# Patient Record
Sex: Male | Born: 1980 | Race: Black or African American | Hispanic: No | Marital: Single | Smoking: Never smoker
Health system: Southern US, Community
[De-identification: ages and names within clinical notes are randomized; demographics above are authoritative.]

## PROBLEM LIST (undated history)

## (undated) DIAGNOSIS — B009 Herpesviral infection, unspecified: Secondary | ICD-10-CM

## (undated) DIAGNOSIS — J45909 Unspecified asthma, uncomplicated: Secondary | ICD-10-CM

## (undated) HISTORY — PX: LEG SURGERY: SHX1003

## (undated) HISTORY — PX: FEMUR FRACTURE SURGERY: SHX633

---

## 1998-06-07 ENCOUNTER — Encounter: Payer: Self-pay | Admitting: Emergency Medicine

## 1998-06-07 ENCOUNTER — Emergency Department (HOSPITAL_COMMUNITY): Admission: EM | Admit: 1998-06-07 | Discharge: 1998-06-07 | Payer: Self-pay | Admitting: Emergency Medicine

## 2002-04-04 ENCOUNTER — Emergency Department (HOSPITAL_COMMUNITY): Admission: EM | Admit: 2002-04-04 | Discharge: 2002-04-04 | Payer: Self-pay | Admitting: Emergency Medicine

## 2005-08-16 ENCOUNTER — Inpatient Hospital Stay (HOSPITAL_COMMUNITY): Admission: EM | Admit: 2005-08-16 | Discharge: 2005-08-18 | Payer: Self-pay | Admitting: Emergency Medicine

## 2016-02-16 ENCOUNTER — Emergency Department (HOSPITAL_BASED_OUTPATIENT_CLINIC_OR_DEPARTMENT_OTHER)
Admission: EM | Admit: 2016-02-16 | Discharge: 2016-02-16 | Disposition: A | Discharge status: Home / Self Care | Payer: BLUE CROSS/BLUE SHIELD | Attending: Emergency Medicine | Admitting: Emergency Medicine

## 2016-02-16 ENCOUNTER — Encounter (HOSPITAL_BASED_OUTPATIENT_CLINIC_OR_DEPARTMENT_OTHER): Payer: Self-pay

## 2016-02-16 ENCOUNTER — Emergency Department (HOSPITAL_BASED_OUTPATIENT_CLINIC_OR_DEPARTMENT_OTHER): Payer: BLUE CROSS/BLUE SHIELD

## 2016-02-16 DIAGNOSIS — J45901 Unspecified asthma with (acute) exacerbation: Secondary | ICD-10-CM | POA: Diagnosis not present

## 2016-02-16 DIAGNOSIS — R0602 Shortness of breath: Secondary | ICD-10-CM | POA: Diagnosis present

## 2016-02-16 DIAGNOSIS — Z79899 Other long term (current) drug therapy: Secondary | ICD-10-CM | POA: Insufficient documentation

## 2016-02-16 HISTORY — DX: Unspecified asthma, uncomplicated: J45.909

## 2016-02-16 LAB — BASIC METABOLIC PANEL
ANION GAP: 9 (ref 5–15)
BUN: 8 mg/dL (ref 6–20)
CALCIUM: 9.4 mg/dL (ref 8.9–10.3)
CHLORIDE: 104 mmol/L (ref 101–111)
CO2: 25 mmol/L (ref 22–32)
Creatinine, Ser: 1.14 mg/dL (ref 0.61–1.24)
GFR calc non Af Amer: >60 mL/min (ref >60)
Glucose, Bld: 110 mg/dL — ABNORMAL HIGH (ref 65–99)
POTASSIUM: 3.4 mmol/L — AB (ref 3.5–5.1)
Sodium: 138 mmol/L (ref 135–145)

## 2016-02-16 LAB — CBC WITH DIFFERENTIAL/PLATELET
BASOS ABS: 0.1 10*3/uL (ref 0.0–0.1)
BASOS PCT: 1 %
Eosinophils Absolute: 1.4 10*3/uL — ABNORMAL HIGH (ref 0.0–0.7)
Eosinophils Relative: 15 %
HEMATOCRIT: 46.4 % (ref 39.0–52.0)
Hemoglobin: 15.9 g/dL (ref 13.0–17.0)
Lymphocytes Relative: 31 %
Lymphs Abs: 2.9 10*3/uL (ref 0.7–4.0)
MCH: 31.4 pg (ref 26.0–34.0)
MCHC: 34.3 g/dL (ref 30.0–36.0)
MCV: 91.5 fL (ref 78.0–100.0)
Monocytes Absolute: 0.7 10*3/uL (ref 0.1–1.0)
Monocytes Relative: 7 %
NEUTROS ABS: 4.3 10*3/uL (ref 1.7–7.7)
NEUTROS PCT: 46 %
Platelets: 238 10*3/uL (ref 150–400)
RBC: 5.07 MIL/uL (ref 4.22–5.81)
RDW: 12.2 % (ref 11.5–15.5)
WBC: 9.3 10*3/uL (ref 4.0–10.5)

## 2016-02-16 LAB — D-DIMER, QUANTITATIVE: D-Dimer, Quant: <0.27 ug/mL-FEU (ref 0.00–0.50)

## 2016-02-16 MED ORDER — MAGNESIUM SULFATE 2 GM/50ML IV SOLN
2.0000 g | Freq: Once | INTRAVENOUS | Status: AC
  Administered 2016-02-16: 2 g via INTRAVENOUS
  Filled 2016-02-16: qty 50

## 2016-02-16 MED ORDER — IPRATROPIUM-ALBUTEROL 0.5-2.5 (3) MG/3ML IN SOLN
RESPIRATORY_TRACT | Status: AC
  Administered 2016-02-16: 9 mL
  Filled 2016-02-16: qty 9

## 2016-02-16 MED ORDER — ALBUTEROL SULFATE HFA 108 (90 BASE) MCG/ACT IN AERS
2.0000 | INHALATION_SPRAY | Freq: Once | RESPIRATORY_TRACT | Status: AC
  Administered 2016-02-16: 2 via RESPIRATORY_TRACT
  Filled 2016-02-16: qty 6.7

## 2016-02-16 MED ORDER — METHYLPREDNISOLONE SODIUM SUCC 125 MG IJ SOLR
125.0000 mg | Freq: Once | INTRAMUSCULAR | Status: AC
  Administered 2016-02-16: 125 mg via INTRAVENOUS

## 2016-02-16 MED ORDER — MOMETASONE FURO-FORMOTEROL FUM 100-5 MCG/ACT IN AERO: 2.0000 | INHALATION_SPRAY | Freq: Two times a day (BID) | RESPIRATORY_TRACT | 0 refills | Status: DC

## 2016-02-16 MED ORDER — PREDNISONE 50 MG PO TABS: 50.0000 mg | ORAL_TABLET | Freq: Every day | ORAL | 0 refills | Status: DC

## 2016-02-16 MED ORDER — PREDNISONE 50 MG PO TABS
60.0000 mg | ORAL_TABLET | Freq: Once | ORAL | Status: DC
  Filled 2016-02-16: qty 1

## 2016-02-16 MED ORDER — IPRATROPIUM BROMIDE 0.02 % IN SOLN
0.5000 mg | Freq: Once | RESPIRATORY_TRACT | Status: AC
Start: 2016-02-16 — End: 2016-02-16
  Administered 2016-02-16: 0.5 mg via RESPIRATORY_TRACT
  Filled 2016-02-16: qty 2.5

## 2016-02-16 MED ORDER — ALBUTEROL (5 MG/ML) CONTINUOUS INHALATION SOLN
10.0000 mg/h | INHALATION_SOLUTION | RESPIRATORY_TRACT | Status: DC
  Administered 2016-02-16: 10 mg/h via RESPIRATORY_TRACT
  Filled 2016-02-16: qty 20

## 2016-02-16 MED ORDER — SODIUM CHLORIDE 0.9 % IV BOLUS (SEPSIS)
1000.0000 mL | Freq: Once | INTRAVENOUS | Status: AC
  Administered 2016-02-16: 1000 mL via INTRAVENOUS

## 2016-02-16 MED ORDER — METHYLPREDNISOLONE SODIUM SUCC 125 MG IJ SOLR
INTRAMUSCULAR | Status: AC
  Filled 2016-02-16: qty 2

## 2016-02-16 NOTE — ED Notes (Signed)
ED Provider at bedside. 

## 2016-02-16 NOTE — ED Triage Notes (Signed)
C/o SOB, wheezing x this am

## 2016-02-16 NOTE — Discharge Instructions (Signed)
Take inhaler 2 puffs every 4 hrs. Take prednisone as prescribed until all gone, next dose tomorrow. Use Dulera as prescribed. Return if worsening symptoms, otherwise follow up with family doctor for recheck.

## 2016-02-16 NOTE — ED Notes (Addendum)
Pt amb w/o difficulty O2sat 95%

## 2016-02-16 NOTE — ED Provider Notes (Signed)
[facility]   CSN: [id number] Arrival date & time: 02/16/16  1637  By signing my name below, I, Javier Dockerobert Ryan Halas, attest that this documentation has been prepared under the direction and in the presence of Karel Jarvisatiana Kirachenko, PA-C. Electronically Signed: Javier Dockerobert Ryan Halas, ER Scribe. 09/11/2015. 5:04 PM.  History   Chief Complaint Chief Complaint  Patient presents with  . Shortness of Breath   The history is provided by the patient. No language interpreter was used.    HPI Comments: Gregory Vance is a 36 y.o. male who presents to the Emergency Department complaining of SOB since this morning. He has a PMHx of asthma. He denies cough, CP, swelling in legs. He has used his inhaler 10 times today before coming in to the ED. He states he visited his daughter at her mother's house and she has a [place] pig. He believes being around the pet exacerbated his asthma sx. He has been doing increased driving to and from [place]. His last asthma exacerbation was two years ago around the same time. He normally uses dulara but has been out of that medication.   He states he was recently prescribed abx due to a skin rash in his groin.   Past Medical History:  Diagnosis Date  . Asthma     There are no active problems to display for this patient.   Past Surgical History:  Procedure Laterality Date  . FEMUR FRACTURE SURGERY       Home Medications    Prior to Admission medications   Medication Sig Start Date End Date Taking? Authorizing Provider  ALBUTEROL IN Inhale into the lungs.   Yes Historical Provider, MD  mometasone-formoterol (DULERA) 100-5 MCG/ACT AERO Inhale 2 puffs into the lungs 2 (two) times daily.   Yes Historical Provider, MD    Family History No family history on file.  Social History Social History  Substance Use Topics  . Smoking status: Never Smoker  . Smokeless tobacco: Never Used  . Alcohol use No     Allergies   Patient has  no known allergies.   Review of Systems Review of Systems  Respiratory: Positive for shortness of breath. Negative for cough.   Cardiovascular: Negative for chest pain.   A complete 10 system review of systems was obtained and all systems are negative except as noted in the HPI and PMH.    Physical Exam Updated Vital Signs BP 134/96 (BP Location: Right Arm)   Pulse (!) 130   Temp 98.1 F (36.7 C) (Oral)   Resp 24   Ht 5\' 7"  (1.702 m)   Wt 185 lb (83.9 kg)   SpO2 (!) 89%   BMI 28.98 kg/m   Physical Exam  Constitutional: He is oriented to person, place, and time. He appears well-developed and well-nourished. No distress.  HENT:  Head: Normocephalic and atraumatic.  Eyes: Pupils are equal, round, and reactive to light.  Neck: Normal range of motion. Neck supple.  Cardiovascular: Normal rate, regular rhythm and normal heart sounds.   Pulmonary/Chest: Effort normal. No respiratory distress. He has wheezes. He has no rales.  Musculoskeletal: Normal range of motion.  Neurological: He is alert and oriented to person, place, and time. Coordination normal.  Skin: Skin is warm and dry. He is not diaphoretic.  Psychiatric: He has a normal mood and affect. His behavior is normal.  Nursing note and vitals reviewed.  5:02 PM O2 sat 91% post neb.     ED  Treatments / Results  DIAGNOSTIC STUDIES: Oxygen Saturation is 89% on RA, low by my interpretation.    COORDINATION OF CARE: 5:05 PM Discussed treatment plan with pt at bedside and pt agreed to plan.  Labs (all labs ordered are listed, but only abnormal results are displayed) Labs Reviewed  CBC WITH DIFFERENTIAL/PLATELET - Abnormal; Notable for the following:       Result Value   Eosinophils Absolute 1.4 (*)    All other components within normal limits  BASIC METABOLIC PANEL - Abnormal; Notable for the following:    Potassium 3.4 (*)    Glucose, Bld 110 (*)    All other components within normal limits  D-DIMER, QUANTITATIVE  (NOT AT [facility])    EKG  EKG Interpretation  Date/Time:  Wednesday February 16 2016 17:13:50 EST Ventricular Rate:  129 PR Interval:    QRS Duration: 82 QT Interval:  307 QTC Calculation: 450 R Axis:   71 Text Interpretation:  Sinus tachycardia Low voltage, extremity and precordial leads No STEMI.  Confirmed by LONG MD, JOSHUA [phone]) on 02/16/2016 5:19:19 PM       Radiology Dg Chest 2 View  Result Date: 02/16/2016 CLINICAL DATA:  Shortness of breath today.  History of asthma. EXAM: CHEST  2 VIEW COMPARISON:  Chest radiograph August 16, 2005 FINDINGS: Cardiomediastinal silhouette is normal. No pleural effusions or focal consolidations. Trachea projects midline and there is no pneumothorax. Soft tissue planes and included osseous structures are non-suspicious. IMPRESSION: Normal chest. Electronically Signed   By: Awilda Metro M.D.   On: 02/16/2016 17:25    Procedures Procedures (including critical care time)  Medications Ordered in ED Medications  methylPREDNISolone sodium succinate (SOLU-MEDROL) 125 mg/2 mL injection (not administered)  albuterol (PROVENTIL,VENTOLIN) solution continuous neb (0 mg/hr Nebulization Stopped 02/16/16 1832)  ipratropium-albuterol (DUONEB) 0.5-2.5 (3) MG/3ML nebulizer solution (9 mLs  Given 02/16/16 1647)  methylPREDNISolone sodium succinate (SOLU-MEDROL) 125 mg/2 mL injection 125 mg (125 mg Intravenous Given 02/16/16 1708)  ipratropium (ATROVENT) nebulizer solution 0.5 mg (0.5 mg Nebulization Given 02/16/16 1741)  sodium chloride 0.9 % bolus 1,000 mL (0 mLs Intravenous Stopped 02/16/16 2000)  magnesium sulfate IVPB 2 g 50 mL (0 g Intravenous Stopped 02/16/16 2000)  albuterol (PROVENTIL HFA;VENTOLIN HFA) 108 (90 Base) MCG/ACT inhaler 2 puff (2 puffs Inhalation Given 02/16/16 2029)     Initial Impression / Assessment and Plan / ED Course  I have reviewed the triage vital signs and the nursing notes.  Pertinent labs & imaging results that were available  during my care of the patient were reviewed by me and considered in my medical decision making (see chart for details).  Clinical Course    Pt in ED with wheezing, SOB, chest tightness onset this morning. Believes triggered by a guinnea pig. Pt with diffuse wheezing, tachypnea, some accesory muscle use. Already started on duoneb by respiratory therapist. Pt is under risk for PE given prolonged car travel, he is tachycardic in 130s, hypoxic with oxygen sat of 89 % on RA. Will get labs, CXR, D dimer.    Pt not improved with breathing treatment. Will start on hour long cont neb. CXR, labs normal.   Pt still wheezing and tachycardic after hour long tx. Suspect a lot of tachycardia is from albuterol, since pt states he feels much better. He is no longer tachypnec, no accessory muscle use. Will try fluids and magnesium.   8:06 PM Pt feels much better. HR is 118. Pt ambulated around dept, oxygen stayed at  95%. Pt states he feels better. He would like to go home. Will dc home with prednisone, continue inhaler, will refill dulera   Final Clinical Impressions(s) / ED Diagnoses   Final diagnoses:  Exacerbation of asthma, unspecified asthma severity, unspecified whether persistent    New Prescriptions Discharge Medication List as of 02/16/2016  8:25 PM    START taking these medications   Details  !! mometasone-formoterol (DULERA) 100-5 MCG/ACT AERO Inhale 2 puffs into the lungs 2 (two) times daily., Starting Wed 02/16/2016, Print    predniSONE (DELTASONE) 50 MG tablet Take 1 tablet (50 mg total) by mouth daily., Starting Wed 02/16/2016, Print     !! - Potential duplicate medications found. Please discuss with provider.     I personally performed the services described in this documentation, which was scribed in my presence. The recorded information has been reviewed and is accurate.      Jaynie Crumble, PA-C 02/16/16 2220    Maia Plan, MD 02/17/16 [phone]

## 2017-11-12 ENCOUNTER — Emergency Department (HOSPITAL_BASED_OUTPATIENT_CLINIC_OR_DEPARTMENT_OTHER)
Admission: EM | Admit: 2017-11-12 | Discharge: 2017-11-13 | Disposition: A | Discharge status: Home / Self Care | Payer: BLUE CROSS/BLUE SHIELD | Attending: Emergency Medicine | Admitting: Emergency Medicine

## 2017-11-12 ENCOUNTER — Encounter (HOSPITAL_BASED_OUTPATIENT_CLINIC_OR_DEPARTMENT_OTHER): Payer: Self-pay

## 2017-11-12 ENCOUNTER — Other Ambulatory Visit: Payer: Self-pay

## 2017-11-12 DIAGNOSIS — J4541 Moderate persistent asthma with (acute) exacerbation: Secondary | ICD-10-CM | POA: Insufficient documentation

## 2017-11-12 DIAGNOSIS — Z79899 Other long term (current) drug therapy: Secondary | ICD-10-CM | POA: Diagnosis not present

## 2017-11-12 DIAGNOSIS — J45909 Unspecified asthma, uncomplicated: Secondary | ICD-10-CM | POA: Diagnosis present

## 2017-11-12 HISTORY — DX: Herpesviral infection, unspecified: B00.9

## 2017-11-12 MED ORDER — IPRATROPIUM-ALBUTEROL 0.5-2.5 (3) MG/3ML IN SOLN
RESPIRATORY_TRACT | Status: AC
  Administered 2017-11-12: 3 mL
  Filled 2017-11-12: qty 3

## 2017-11-12 MED ORDER — ALBUTEROL (5 MG/ML) CONTINUOUS INHALATION SOLN
15.0000 mg/h | INHALATION_SOLUTION | RESPIRATORY_TRACT | Status: AC
  Administered 2017-11-12: 15 mg/h via RESPIRATORY_TRACT
  Filled 2017-11-12: qty 20

## 2017-11-12 MED ORDER — ALBUTEROL SULFATE (2.5 MG/3ML) 0.083% IN NEBU
INHALATION_SOLUTION | RESPIRATORY_TRACT | Status: AC
  Administered 2017-11-12: 2.5 mg
  Filled 2017-11-12: qty 3

## 2017-11-12 MED ORDER — PREDNISONE 50 MG PO TABS: 50.0000 mg | ORAL_TABLET | Freq: Every day | ORAL | 0 refills | Status: DC

## 2017-11-12 MED ORDER — MOMETASONE FURO-FORMOTEROL FUM 100-5 MCG/ACT IN AERO: 2.0000 | INHALATION_SPRAY | Freq: Two times a day (BID) | RESPIRATORY_TRACT | 0 refills | Status: DC

## 2017-11-12 MED ORDER — PREDNISONE 50 MG PO TABS
60.0000 mg | ORAL_TABLET | Freq: Once | ORAL | Status: AC
  Administered 2017-11-12: 60 mg via ORAL
  Filled 2017-11-12: qty 1

## 2017-11-12 NOTE — Discharge Instructions (Addendum)
Follow up with your doctor for recheck. Return here as needed.  °

## 2017-11-12 NOTE — ED Notes (Signed)
Ambulated patient via pulse ox. Resting HR 110 /oxygen saturations of 96%. Patient maintained steady gait and able to speak in complete sentences without difficulty. Patient oxygen saturations ranged from 94%-96% and heart rate of 110-114. Patient returned to room. Patient tolerated well.

## 2017-11-12 NOTE — ED Provider Notes (Signed)
[facility] EMERGENCY DEPARTMENT Provider Note   CSN: [id number] Arrival date & time: 11/12/17  2042     History   Chief Complaint Chief Complaint  Patient presents with  . Asthma    HPI Gregory Vance is a 37 y.o. male.  HPI Patient presents to the emergency department with exacerbation of his asthma.  The patient states he has not seen his primary doctor.  Patient states that he got worse over the last 2 weeks.  The patient states he is having increased mucus production as well.  He has been giving himself nebulizer treatments at home without significant relief.  The patient denies chest pain,  headache,blurred vision, neck pain, fever, cough, weakness, numbness, dizziness, anorexia, edema, abdominal pain, nausea, vomiting, diarrhea, rash, back pain, dysuria, hematemesis, bloody stool, near syncope, or syncope. Past Medical History:  Diagnosis Date  . Asthma   . Herpes     There are no active problems to display for this patient.   Past Surgical History:  Procedure Laterality Date  . FEMUR FRACTURE SURGERY    . LEG SURGERY          Home Medications    Prior to Admission medications   Medication Sig Start Date End Date Taking? Authorizing Provider  ALBUTEROL IN Inhale into the lungs.    [provider]  mometasone-formoterol (DULERA) 100-5 MCG/ACT AERO Inhale 2 puffs into the lungs 2 (two) times daily.    [provider]  mometasone-formoterol (DULERA) 100-5 MCG/ACT AERO Inhale 2 puffs into the lungs 2 (two) times daily. 02/16/16   Kirichenko, Lemont Fillers, PA-C  predniSONE (DELTASONE) 50 MG tablet Take 1 tablet (50 mg total) by mouth daily. 02/16/16   Jaynie Crumble, PA-C    Family History No family history on file.  Social History Social History   Tobacco Use  . Smoking status: Never Smoker  . Smokeless tobacco: Never Used  Substance Use Topics  . Alcohol use: No  . Drug use: No     Allergies   Patient has no known  allergies.   Review of Systems Review of Systems All other systems negative except as documented in the HPI. All pertinent positives and negatives as reviewed in the HPI.  Physical Exam Updated Vital Signs BP (!) 141/109 (BP Location: Right Arm)   Pulse (!) 110   Temp 98.7 F (37.1 C) (Oral)   Resp 16   Ht 5\' 7"  (1.702 m)   Wt 83.8 kg   SpO2 94%   BMI 28.94 kg/m   Physical Exam  Constitutional: He is oriented to person, place, and time. He appears well-developed and well-nourished. No distress.  HENT:  Head: Normocephalic and atraumatic.  Mouth/Throat: Oropharynx is clear and moist.  Eyes: Pupils are equal, round, and reactive to light.  Neck: Normal range of motion. Neck supple.  Cardiovascular: Normal rate, regular rhythm and normal heart sounds. Exam reveals no gallop and no friction rub.  No murmur heard. Pulmonary/Chest: Effort normal. No stridor. No respiratory distress. He has wheezes. He has no rales. He exhibits no tenderness.  Abdominal: Soft. Bowel sounds are normal. He exhibits no distension. There is no tenderness.  Neurological: He is alert and oriented to person, place, and time. He exhibits normal muscle tone. Coordination normal.  Skin: Skin is warm and dry. Capillary refill takes less than 2 seconds. No rash noted. No erythema.  Psychiatric: He has a normal mood and affect. His behavior is normal.  Nursing note and vitals reviewed.  ED Treatments / Results  Labs (all labs ordered are listed, but only abnormal results are displayed) Labs Reviewed - No data to display  EKG None  Radiology No results found.  Procedures Procedures (including critical care time)  Medications Ordered in ED Medications  albuterol (PROVENTIL,VENTOLIN) solution continuous neb (0 mg/hr Nebulization Stopped 11/12/17 2306)  albuterol (PROVENTIL) (2.5 MG/3ML) 0.083% nebulizer solution (2.5 mg  Given 11/12/17 2101)  ipratropium-albuterol (DUONEB) 0.5-2.5 (3) MG/3ML  nebulizer solution (3 mLs  Given 11/12/17 2101)  predniSONE (DELTASONE) tablet 60 mg (60 mg Oral Given 11/12/17 2216)     Initial Impression / Assessment and Plan / ED Course  I have reviewed the triage vital signs and the nursing notes.  Pertinent labs & imaging results that were available during my care of the patient were reviewed by me and considered in my medical decision making (see chart for details).     Patient was given multiple neb treatments calling an hour-long neb treatment.  He was given steroids in the emergency department.  Patient is feeling dramatically better and sounds improved on examination after reassessment.  The patient was ambulated and was able to maintain oxygen saturations above 95%.  Patient states he would like to be discharged home.  Patient is advised he will need to see his primary care doctor soon as possible.  Final Clinical Impressions(s) / ED Diagnoses   Final diagnoses:  None    ED Discharge Orders    None       Charlestine Night, PA-C 11/14/17 0013    Gwyneth Sprout, MD 11/14/17 [phone]

## 2017-11-12 NOTE — ED Triage Notes (Addendum)
C/o "trouble managing my asthma for about 2 weeks"-prod cough x 1 week-states used neb and inhaler just PTA-NAD-steady gait

## 2020-02-08 ENCOUNTER — Emergency Department (HOSPITAL_BASED_OUTPATIENT_CLINIC_OR_DEPARTMENT_OTHER)
Admission: EM | Admit: 2020-02-08 | Discharge: 2020-02-08 | Disposition: A | Discharge status: Home / Self Care | Payer: Managed Care, Other (non HMO) | Attending: Emergency Medicine | Admitting: Emergency Medicine

## 2020-02-08 ENCOUNTER — Encounter (HOSPITAL_BASED_OUTPATIENT_CLINIC_OR_DEPARTMENT_OTHER): Payer: Self-pay | Admitting: Emergency Medicine

## 2020-02-08 ENCOUNTER — Other Ambulatory Visit: Payer: Self-pay

## 2020-02-08 DIAGNOSIS — R0602 Shortness of breath: Secondary | ICD-10-CM | POA: Diagnosis present

## 2020-02-08 DIAGNOSIS — Z7951 Long term (current) use of inhaled steroids: Secondary | ICD-10-CM | POA: Insufficient documentation

## 2020-02-08 DIAGNOSIS — Z20822 Contact with and (suspected) exposure to covid-19: Secondary | ICD-10-CM | POA: Insufficient documentation

## 2020-02-08 DIAGNOSIS — J45901 Unspecified asthma with (acute) exacerbation: Secondary | ICD-10-CM | POA: Diagnosis not present

## 2020-02-08 MED ORDER — ALBUTEROL SULFATE HFA 108 (90 BASE) MCG/ACT IN AERS
2.0000 | INHALATION_SPRAY | RESPIRATORY_TRACT | Status: DC | PRN
  Administered 2020-02-08: 2 via RESPIRATORY_TRACT
  Filled 2020-02-08: qty 6.7

## 2020-02-08 MED ORDER — PREDNISONE 20 MG PO TABS
40.0000 mg | ORAL_TABLET | Freq: Once | ORAL | Status: AC
  Administered 2020-02-08: 40 mg via ORAL
  Filled 2020-02-08: qty 2

## 2020-02-08 MED ORDER — PREDNISONE 20 MG PO TABS: 40.0000 mg | ORAL_TABLET | Freq: Every day | ORAL | 0 refills | Status: DC

## 2020-02-08 NOTE — ED Triage Notes (Signed)
Reports asthma attack for the last two days.  No relief with proair at home.  Last dose was at 2000.  Used albuterol neb just pta.

## 2020-02-08 NOTE — ED Provider Notes (Signed)
[facility] EMERGENCY DEPARTMENT Provider Note   CSN: [id number] Arrival date & time: 02/08/20  2211     History Chief Complaint  Patient presents with  . Asthma    Gregory Vance is a 40 y.o. male.  HPI Patient presents with asthma attack.  States he has been short of breath for last couple days.  States has felt tight.  Mild cough.  No fevers.  No real sputum production.  Uses inhaler.  States he is now feeling better since he used a big dose right before he got here.  No swelling his legs.  No known sick contacts.  States his family did get a new dog that he thinks is causing the asthma to flareup.  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  He hypoallergenic.  States it has been worse since he has been around the dog.  Even without the dog he states that he has been using his inhaler every day.  Has a follow-up with his primary care doctor tomorrow.  Has been vaccinated for COVID but second dose was just a couple weeks ago.    Past Medical History:  Diagnosis Date  . Asthma   . Herpes     There are no problems to display for this patient.   Past Surgical History:  Procedure Laterality Date  . FEMUR FRACTURE SURGERY    . LEG SURGERY         No family history on file.  Social History   Tobacco Use  . Smoking status: Never Smoker  . Smokeless tobacco: Never Used  Substance Use Topics  . Alcohol use: No  . Drug use: No    Home Medications Prior to Admission medications   Medication Sig Start Date End Date Taking? Authorizing Provider  predniSONE (DELTASONE) 20 MG tablet Take 2 tablets (40 mg total) by mouth daily. 02/08/20  Yes Benjiman Core, MD  ALBUTEROL IN Inhale into the lungs.    [provider]  mometasone-formoterol (DULERA) 100-5 MCG/ACT AERO Inhale 2 puffs into the lungs 2 (two) times daily. 11/12/17   Charlestine Night, PA-C    Allergies    Patient has no known allergies.  Review of Systems   Review of Systems   Constitutional: Negative for appetite change and fatigue.  HENT: Negative for congestion.   Respiratory: Positive for cough, shortness of breath and wheezing.   Cardiovascular: Negative for chest pain.  Gastrointestinal: Negative for abdominal pain.  Endocrine: Negative for polyuria.  Genitourinary: Negative for flank pain.  Musculoskeletal: Negative for neck pain.  Skin: Negative for rash.  Neurological: Negative for weakness.  Psychiatric/Behavioral: Negative for confusion.    Physical Exam Updated Vital Signs BP (!) 141/105   Pulse (!) 103   Temp 98 F (36.7 C) (Oral)   Resp 20   Ht 5\' 7"  (1.702 m)   Wt 88.5 kg   SpO2 96%   BMI 30.54 kg/m   Physical Exam Vitals reviewed.  HENT:     Head: Atraumatic.  Eyes:     Pupils: Pupils are equal, round, and reactive to light.  Cardiovascular:     Comments: Mild tachycardia. Pulmonary:     Comments: Lungs clear.  No respiratory distress. Abdominal:     Tenderness: There is no abdominal tenderness.  Musculoskeletal:        General: No tenderness.     Cervical back: Neck supple.  Skin:    General: Skin is warm.     Capillary Refill:  Capillary refill takes less than 2 seconds.  Neurological:     Mental Status: He is alert and oriented to person, place, and time.     ED Results / Procedures / Treatments   Labs (all labs ordered are listed, but only abnormal results are displayed) Labs Reviewed  SARS CORONAVIRUS 2 (TAT 6-24 HRS)    EKG None  Radiology No results found.  Procedures Procedures (including critical care time)  Medications Ordered in ED Medications  albuterol (VENTOLIN HFA) 108 (90 Base) MCG/ACT inhaler 2 puff (2 puffs Inhalation Given 02/08/20 2321)  predniSONE (DELTASONE) tablet 40 mg (40 mg Oral Given 02/08/20 2316)    ED Course  I have reviewed the triage vital signs and the nursing notes.  Pertinent labs & imaging results that were available during my care of the patient were reviewed by me  and considered in my medical decision making (see chart for details).    MDM Rules/Calculators/A&P                          Patient with likely asthma exacerbation.  Potentially secondary to new dog at the house.  However potentially could be viral infection and COVID test is been sent.  Lungs are clear.  Patient is feeling better.  Given 2 more puffs of inhaler here and had an inhaler to go.  Given prednisone since continued wheezing and if it is the dog of the insult will still be there.  Has follow-up with PCP.  Patient has been using his inhaler every day which is likely poor overall control of his asthma.  May need more every day control. Final Clinical Impression(s) / ED Diagnoses Final diagnoses:  Exacerbation of persistent asthma, unspecified asthma severity    Rx / DC Orders ED Discharge Orders         Ordered    predniSONE (DELTASONE) 20 MG tablet  Daily        02/08/20 2341           Benjiman Core, MD 02/08/20 2345

## 2020-02-08 NOTE — Discharge Instructions (Signed)
Follow-up with your doctor.  You may need better control of your asthma.  A COVID test has been done but has not resulted yet.

## 2020-02-09 LAB — SARS CORONAVIRUS 2 (TAT 6-24 HRS): SARS Coronavirus 2: NEGATIVE

## 2020-10-07 ENCOUNTER — Encounter (HOSPITAL_BASED_OUTPATIENT_CLINIC_OR_DEPARTMENT_OTHER): Payer: Self-pay

## 2020-10-07 ENCOUNTER — Emergency Department (HOSPITAL_BASED_OUTPATIENT_CLINIC_OR_DEPARTMENT_OTHER)
Admission: EM | Admit: 2020-10-07 | Discharge: 2020-10-07 | Disposition: A | Discharge status: Home / Self Care | Payer: BC Managed Care – PPO | Attending: Emergency Medicine | Admitting: Emergency Medicine

## 2020-10-07 ENCOUNTER — Emergency Department (HOSPITAL_BASED_OUTPATIENT_CLINIC_OR_DEPARTMENT_OTHER): Payer: BC Managed Care – PPO

## 2020-10-07 ENCOUNTER — Other Ambulatory Visit: Payer: Self-pay

## 2020-10-07 DIAGNOSIS — R0682 Tachypnea, not elsewhere classified: Secondary | ICD-10-CM | POA: Insufficient documentation

## 2020-10-07 DIAGNOSIS — R000 Tachycardia, unspecified: Secondary | ICD-10-CM | POA: Diagnosis not present

## 2020-10-07 DIAGNOSIS — J45901 Unspecified asthma with (acute) exacerbation: Secondary | ICD-10-CM | POA: Insufficient documentation

## 2020-10-07 DIAGNOSIS — Z20822 Contact with and (suspected) exposure to covid-19: Secondary | ICD-10-CM | POA: Insufficient documentation

## 2020-10-07 DIAGNOSIS — R059 Cough, unspecified: Secondary | ICD-10-CM | POA: Diagnosis present

## 2020-10-07 LAB — CBC WITH DIFFERENTIAL/PLATELET
Abs Immature Granulocytes: 0.01 10*3/uL (ref 0.00–0.07)
Basophils Absolute: 0.1 10*3/uL (ref 0.0–0.1)
Basophils Relative: 1 %
Eosinophils Absolute: 1.3 10*3/uL — ABNORMAL HIGH (ref 0.0–0.5)
Eosinophils Relative: 14 %
HCT: 45.1 % (ref 39.0–52.0)
Hemoglobin: 15.4 g/dL (ref 13.0–17.0)
Immature Granulocytes: 0 %
Lymphocytes Relative: 33 %
Lymphs Abs: 3.1 10*3/uL (ref 0.7–4.0)
MCH: 31.5 pg (ref 26.0–34.0)
MCHC: 34.1 g/dL (ref 30.0–36.0)
MCV: 92.2 fL (ref 80.0–100.0)
Monocytes Absolute: 0.7 10*3/uL (ref 0.1–1.0)
Monocytes Relative: 7 %
Neutro Abs: 4.2 10*3/uL (ref 1.7–7.7)
Neutrophils Relative %: 45 %
Platelets: 246 10*3/uL (ref 150–400)
RBC: 4.89 MIL/uL (ref 4.22–5.81)
RDW: 12.4 % (ref 11.5–15.5)
WBC: 9.5 10*3/uL (ref 4.0–10.5)
nRBC: 0 % (ref 0.0–0.2)

## 2020-10-07 LAB — RESP PANEL BY RT-PCR (FLU A&B, COVID) ARPGX2
Influenza A by PCR: NEGATIVE
Influenza B by PCR: NEGATIVE
SARS Coronavirus 2 by RT PCR: NEGATIVE

## 2020-10-07 LAB — BASIC METABOLIC PANEL
Anion gap: 7 (ref 5–15)
BUN: 8 mg/dL (ref 6–20)
CO2: 27 mmol/L (ref 22–32)
Calcium: 9.3 mg/dL (ref 8.9–10.3)
Chloride: 105 mmol/L (ref 98–111)
Creatinine, Ser: 1.19 mg/dL (ref 0.61–1.24)
GFR, Estimated: >60 mL/min (ref >60)
Glucose, Bld: 91 mg/dL (ref 70–99)
Potassium: 3.6 mmol/L (ref 3.5–5.1)
Sodium: 139 mmol/L (ref 135–145)

## 2020-10-07 MED ORDER — MAGNESIUM SULFATE 2 GM/50ML IV SOLN
2.0000 g | Freq: Once | INTRAVENOUS | Status: AC
  Administered 2020-10-07: 2 g via INTRAVENOUS
  Filled 2020-10-07: qty 50

## 2020-10-07 MED ORDER — PREDNISONE 10 MG PO TABS: 40.0000 mg | ORAL_TABLET | Freq: Every day | ORAL | 0 refills | Status: AC

## 2020-10-07 MED ORDER — IPRATROPIUM BROMIDE 0.02 % IN SOLN
RESPIRATORY_TRACT | Status: AC
  Administered 2020-10-07: 0.5 mg via RESPIRATORY_TRACT
  Filled 2020-10-07: qty 2.5

## 2020-10-07 MED ORDER — ALBUTEROL (5 MG/ML) CONTINUOUS INHALATION SOLN
10.0000 mg/h | INHALATION_SOLUTION | RESPIRATORY_TRACT | Status: AC
  Administered 2020-10-07: 10 mg/h via RESPIRATORY_TRACT

## 2020-10-07 MED ORDER — ALBUTEROL SULFATE (2.5 MG/3ML) 0.083% IN NEBU: 2.5000 mg | INHALATION_SOLUTION | Freq: Four times a day (QID) | RESPIRATORY_TRACT | 12 refills | Status: DC | PRN

## 2020-10-07 MED ORDER — METHYLPREDNISOLONE SODIUM SUCC 125 MG IJ SOLR
125.0000 mg | Freq: Once | INTRAMUSCULAR | Status: AC
  Administered 2020-10-07: 125 mg via INTRAVENOUS
  Filled 2020-10-07: qty 2

## 2020-10-07 MED ORDER — ALBUTEROL (5 MG/ML) CONTINUOUS INHALATION SOLN
10.0000 mg/h | INHALATION_SOLUTION | Freq: Once | RESPIRATORY_TRACT | Status: AC
  Administered 2020-10-07: 10 mg/h via RESPIRATORY_TRACT
  Filled 2020-10-07: qty 20

## 2020-10-07 MED ORDER — IPRATROPIUM BROMIDE 0.02 % IN SOLN
0.5000 mg | Freq: Once | RESPIRATORY_TRACT | Status: AC
  Filled 2020-10-07: qty 2.5

## 2020-10-07 MED ORDER — BUDESONIDE-FORMOTEROL FUMARATE 80-4.5 MCG/ACT IN AERO: 2.0000 | INHALATION_SPRAY | RESPIRATORY_TRACT | 0 refills | Status: DC | PRN

## 2020-10-07 MED ORDER — IPRATROPIUM-ALBUTEROL 0.5-2.5 (3) MG/3ML IN SOLN
3.0000 mL | Freq: Once | RESPIRATORY_TRACT | Status: AC
  Administered 2020-10-07: 3 mL via RESPIRATORY_TRACT
  Filled 2020-10-07: qty 3

## 2020-10-07 MED ORDER — ALBUTEROL SULFATE (2.5 MG/3ML) 0.083% IN NEBU
2.5000 mg | INHALATION_SOLUTION | Freq: Once | RESPIRATORY_TRACT | Status: AC
  Administered 2020-10-07: 2.5 mg via RESPIRATORY_TRACT
  Filled 2020-10-07: qty 3

## 2020-10-07 NOTE — ED Triage Notes (Signed)
Pt c/o flu like sx x 1 week-hx asthma-RT in with pt

## 2020-10-07 NOTE — ED Provider Notes (Signed)
[facility] EMERGENCY DEPARTMENT Provider Note   CSN: [id number] Arrival date & time: 10/07/20  2016     History Chief Complaint  Patient presents with   Cough    Gregory Vance is a 40 y.o. male.  The history is provided by the patient.  Cough Cough characteristics:  Productive Sputum characteristics:  Nondescript Severity:  Mild Duration:  5 days Timing:  Constant Progression:  Unchanged Chronicity:  New Relieved by:  Home nebulizer Ineffective treatments:  Home nebulizer Associated symptoms: shortness of breath and wheezing   Associated symptoms: no chest pain, no chills, no fever, no headaches, no myalgias and no rhinorrhea    40 year old male with a history of asthma presenting to the emergency department with concern for an asthma exacerbation.  The patient states that he has run out of his home nebulizer.  He presents to the emergency department tachypneic, expiratory wheezing present, saturating 91% on room air.  Past Medical History:  Diagnosis Date   Asthma    Herpes     There are no problems to display for this patient.   Past Surgical History:  Procedure Laterality Date   FEMUR FRACTURE SURGERY     LEG SURGERY         No family history on file.  Social History   Tobacco Use   Smoking status: Never   Smokeless tobacco: Never  Vaping Use   Vaping Use: Never used  Substance Use Topics   Alcohol use: No   Drug use: No    Home Medications Prior to Admission medications   Medication Sig Start Date End Date Taking? Authorizing Provider  albuterol (PROVENTIL) (2.5 MG/3ML) 0.083% nebulizer solution Take 3 mLs (2.5 mg total) by nebulization every 6 (six) hours as needed for wheezing or shortness of breath. 10/07/20  Yes Ernie Avena, MD  predniSONE (DELTASONE) 10 MG tablet Take 4 tablets (40 mg total) by mouth daily for 5 days. 10/07/20 10/12/20 Yes Ernie Avena, MD  ALBUTEROL IN Inhale into the lungs.    [provider]   budesonide-formoterol (SYMBICORT) 80-4.5 MCG/ACT inhaler Inhale 2 puffs into the lungs as needed. 10/07/20   Ernie Avena, MD    Allergies    Patient has no known allergies.  Review of Systems   Review of Systems  Constitutional:  Negative for chills and fever.  HENT:  Negative for rhinorrhea.   Respiratory:  Positive for cough, shortness of breath and wheezing.   Cardiovascular:  Negative for chest pain.  Musculoskeletal:  Negative for myalgias.  Neurological:  Negative for headaches.  All other systems reviewed and are negative.  Physical Exam Updated Vital Signs BP (!) 150/109   Pulse (!) 113   Temp 98.4 F (36.9 C) (Oral)   Resp (!) 28   SpO2 92%   Physical Exam Vitals and nursing note reviewed.  Constitutional:      General: He is in acute distress.     Appearance: He is well-developed.  HENT:     Head: Normocephalic and atraumatic.  Eyes:     Conjunctiva/sclera: Conjunctivae normal.  Cardiovascular:     Rate and Rhythm: Regular rhythm. Tachycardia present.     Heart sounds: No murmur heard. Pulmonary:     Effort: Respiratory distress present.     Breath sounds: Wheezing present.     Comments: Tachypnea, expiratory wheezing present Abdominal:     Palpations: Abdomen is soft.     Tenderness: There is no abdominal tenderness.  Musculoskeletal:  Cervical back: Neck supple.  Skin:    General: Skin is warm and dry.  Neurological:     General: No focal deficit present.     Mental Status: He is alert. Mental status is at baseline.    ED Results / Procedures / Treatments   Labs (all labs ordered are listed, but only abnormal results are displayed) Labs Reviewed  CBC WITH DIFFERENTIAL/PLATELET - Abnormal; Notable for the following components:      Result Value   Eosinophils Absolute 1.3 (*)    All other components within normal limits  RESP PANEL BY RT-PCR (FLU A&B, COVID) ARPGX2  BASIC METABOLIC PANEL    EKG None  Radiology DG Chest Port 1  View  Result Date: 10/07/2020 CLINICAL DATA:  Flu like symptoms EXAM: PORTABLE CHEST 1 VIEW COMPARISON:  02/16/2016 FINDINGS: The heart size and mediastinal contours are within normal limits. Both lungs are clear. The visualized skeletal structures are unremarkable. IMPRESSION: No active disease. Electronically Signed   By: Jasmine Pang M.D.   On: 10/07/2020 21:00    Procedures Procedures   Medications Ordered in ED Medications  albuterol (PROVENTIL,VENTOLIN) solution continuous neb (0 mg/hr Nebulization Stopped 10/07/20 2251)  ipratropium-albuterol (DUONEB) 0.5-2.5 (3) MG/3ML nebulizer solution 3 mL (3 mLs Nebulization Given 10/07/20 2033)  albuterol (PROVENTIL) (2.5 MG/3ML) 0.083% nebulizer solution 2.5 mg (2.5 mg Nebulization Given 10/07/20 2033)  albuterol (PROVENTIL,VENTOLIN) solution continuous neb (10 mg/hr Nebulization Given 10/07/20 2048)  ipratropium (ATROVENT) nebulizer solution 0.5 mg (0.5 mg Nebulization Given 10/07/20 2049)  magnesium sulfate IVPB 2 g 50 mL (0 g Intravenous Stopped 10/07/20 2152)  methylPREDNISolone sodium succinate (SOLU-MEDROL) 125 mg/2 mL injection 125 mg (125 mg Intravenous Given 10/07/20 2051)    ED Course  I have reviewed the triage vital signs and the nursing notes.  Pertinent labs & imaging results that were available during my care of the patient were reviewed by me and considered in my medical decision making (see chart for details).    MDM Rules/Calculators/A&P                           40 year old male with a history of asthma presenting to the emergency department with an asthma exacerbation.  He states that he ran out of some of his home medications.  No recent fever or chills but does endorse an increasingly productive cough over the past few days with some myalgias.  On arrival, the patient was tachypneic with expiratory wheezing present.  Vitals significant for afebrile, tachycardic P1 18, tachypneic RR 24, hypertensive BP 154/120, saturating 95% on room  air.  IV access was obtained and the patient was administered Solu-Medrol, IV magnesium and was started on a single DuoNeb.  He was promptly switched to continuous due to who was for persistent tachypnea and decreased air movement.  He remained on continuous albuterol nebulizer for an hour.   Work-up to include COVID-19 and influenza PCR resulted negative, BNP normal, CBC without a leukocytosis, anemia or platelet abnormality, chest x-ray with no active disease.  Following nebulizer treatments, IV magnesium, IV Solu-Medrol, the patient was reassessed and was found to be resting comfortably, no respiratory distress, mild expiratory wheezing present.  The patient states that he feels much improved.  The patient was observed in the emergency department for over 3 and half hours following his initial presentation with notable improvement.  He is requesting discharge.  I feel that this is appropriate at this time.  His home asthma medications were reordered.  We will place the patient on a steroid burst. Final Clinical Impression(s) / ED Diagnoses Final diagnoses:  Exacerbation of asthma, unspecified asthma severity, unspecified whether persistent    Rx / DC Orders ED Discharge Orders          Ordered    predniSONE (DELTASONE) 10 MG tablet  Daily        10/07/20 2318    budesonide-formoterol (SYMBICORT) 80-4.5 MCG/ACT inhaler  As needed        10/07/20 2322    albuterol (PROVENTIL) (2.5 MG/3ML) 0.083% nebulizer solution  Every 6 hours PRN        10/07/20 2322             Ernie Avena, MD 10/08/20 1744

## 2021-04-30 ENCOUNTER — Emergency Department (HOSPITAL_BASED_OUTPATIENT_CLINIC_OR_DEPARTMENT_OTHER): Payer: Managed Care, Other (non HMO)

## 2021-04-30 ENCOUNTER — Observation Stay (HOSPITAL_BASED_OUTPATIENT_CLINIC_OR_DEPARTMENT_OTHER)
Admission: EM | Admit: 2021-04-30 | Discharge: 2021-05-01 | Disposition: A | Discharge status: Home / Self Care | Payer: Managed Care, Other (non HMO) | Attending: Family Medicine | Admitting: Family Medicine

## 2021-04-30 ENCOUNTER — Other Ambulatory Visit: Payer: Self-pay

## 2021-04-30 ENCOUNTER — Encounter (HOSPITAL_BASED_OUTPATIENT_CLINIC_OR_DEPARTMENT_OTHER): Payer: Self-pay | Admitting: Emergency Medicine

## 2021-04-30 DIAGNOSIS — Z20822 Contact with and (suspected) exposure to covid-19: Secondary | ICD-10-CM | POA: Insufficient documentation

## 2021-04-30 DIAGNOSIS — J45901 Unspecified asthma with (acute) exacerbation: Secondary | ICD-10-CM

## 2021-04-30 DIAGNOSIS — I10 Essential (primary) hypertension: Secondary | ICD-10-CM | POA: Insufficient documentation

## 2021-04-30 DIAGNOSIS — J4551 Severe persistent asthma with (acute) exacerbation: Principal | ICD-10-CM | POA: Insufficient documentation

## 2021-04-30 DIAGNOSIS — J9601 Acute respiratory failure with hypoxia: Secondary | ICD-10-CM

## 2021-04-30 DIAGNOSIS — E876 Hypokalemia: Secondary | ICD-10-CM

## 2021-04-30 DIAGNOSIS — J4541 Moderate persistent asthma with (acute) exacerbation: Secondary | ICD-10-CM

## 2021-04-30 DIAGNOSIS — Z79899 Other long term (current) drug therapy: Secondary | ICD-10-CM | POA: Insufficient documentation

## 2021-04-30 LAB — BASIC METABOLIC PANEL
Anion gap: 9 (ref 5–15)
Anion gap: 8 (ref 5–15)
BUN: 8 mg/dL (ref 6–20)
BUN: 8 mg/dL (ref 6–20)
CO2: 26 mmol/L (ref 22–32)
CO2: 26 mmol/L (ref 22–32)
Calcium: 9.3 mg/dL (ref 8.9–10.3)
Calcium: 9.4 mg/dL (ref 8.9–10.3)
Chloride: 105 mmol/L (ref 98–111)
Chloride: 105 mmol/L (ref 98–111)
Creatinine, Ser: 1.15 mg/dL (ref 0.61–1.24)
Creatinine, Ser: 1.03 mg/dL (ref 0.61–1.24)
GFR, Estimated: >60 mL/min (ref >60)
GFR, Estimated: >60 mL/min (ref >60)
Glucose, Bld: 90 mg/dL (ref 70–99)
Glucose, Bld: 136 mg/dL — ABNORMAL HIGH (ref 70–99)
Potassium: 3.4 mmol/L — ABNORMAL LOW (ref 3.5–5.1)
Potassium: 3.6 mmol/L (ref 3.5–5.1)
Sodium: 140 mmol/L (ref 135–145)
Sodium: 139 mmol/L (ref 135–145)

## 2021-04-30 LAB — CBC
HCT: 45.5 % (ref 39.0–52.0)
Hemoglobin: 15.4 g/dL (ref 13.0–17.0)
MCH: 31.3 pg (ref 26.0–34.0)
MCHC: 33.8 g/dL (ref 30.0–36.0)
MCV: 92.5 fL (ref 80.0–100.0)
Platelets: 351 10*3/uL (ref 150–400)
RBC: 4.92 MIL/uL (ref 4.22–5.81)
RDW: 12.3 % (ref 11.5–15.5)
WBC: 9.8 10*3/uL (ref 4.0–10.5)
nRBC: 0 % (ref 0.0–0.2)

## 2021-04-30 LAB — RESP PANEL BY RT-PCR (FLU A&B, COVID) ARPGX2
Influenza A by PCR: NEGATIVE
Influenza B by PCR: NEGATIVE
SARS Coronavirus 2 by RT PCR: NEGATIVE

## 2021-04-30 MED ORDER — IPRATROPIUM-ALBUTEROL 0.5-2.5 (3) MG/3ML IN SOLN
3.0000 mL | RESPIRATORY_TRACT | Status: AC
  Administered 2021-04-30: 3 mL via RESPIRATORY_TRACT

## 2021-04-30 MED ORDER — LEVALBUTEROL HCL 0.63 MG/3ML IN NEBU
0.6300 mg | INHALATION_SOLUTION | Freq: Four times a day (QID) | RESPIRATORY_TRACT | Status: DC
  Administered 2021-04-30 – 2021-05-01 (×3): 0.63 mg via RESPIRATORY_TRACT
  Filled 2021-04-30 (×3): qty 3

## 2021-04-30 MED ORDER — IPRATROPIUM BROMIDE 0.02 % IN SOLN
0.5000 mg | Freq: Once | RESPIRATORY_TRACT | Status: AC
  Administered 2021-04-30: 0.5 mg via RESPIRATORY_TRACT
  Filled 2021-04-30: qty 2.5

## 2021-04-30 MED ORDER — IPRATROPIUM-ALBUTEROL 0.5-2.5 (3) MG/3ML IN SOLN
3.0000 mL | RESPIRATORY_TRACT | Status: AC
  Administered 2021-04-30: 3 mL via RESPIRATORY_TRACT
  Filled 2021-04-30: qty 9

## 2021-04-30 MED ORDER — METHYLPREDNISOLONE SODIUM SUCC 125 MG IJ SOLR
125.0000 mg | INTRAMUSCULAR | Status: AC
  Administered 2021-04-30: 125 mg via INTRAVENOUS
  Filled 2021-04-30: qty 2

## 2021-04-30 MED ORDER — ACETAMINOPHEN 325 MG PO TABS
650.0000 mg | ORAL_TABLET | Freq: Four times a day (QID) | ORAL | Status: DC | PRN
  Administered 2021-04-30: 650 mg via ORAL
  Filled 2021-04-30: qty 2

## 2021-04-30 MED ORDER — POTASSIUM CHLORIDE CRYS ER 20 MEQ PO TBCR
40.0000 meq | EXTENDED_RELEASE_TABLET | Freq: Once | ORAL | Status: AC
  Administered 2021-04-30: 40 meq via ORAL
  Filled 2021-04-30: qty 2

## 2021-04-30 MED ORDER — AEROCHAMBER PLUS FLO-VU MEDIUM MISC
1.0000 | Freq: Once | Status: DC
  Filled 2021-04-30: qty 1

## 2021-04-30 MED ORDER — MOMETASONE FURO-FORMOTEROL FUM 200-5 MCG/ACT IN AERO
2.0000 | INHALATION_SPRAY | Freq: Two times a day (BID) | RESPIRATORY_TRACT | Status: DC
  Administered 2021-05-01: 2 via RESPIRATORY_TRACT
  Filled 2021-04-30: qty 8.8

## 2021-04-30 MED ORDER — ENOXAPARIN SODIUM 40 MG/0.4ML IJ SOSY: 40.0000 mg | PREFILLED_SYRINGE | Freq: Every day | INTRAMUSCULAR | Status: DC

## 2021-04-30 MED ORDER — ALBUTEROL (5 MG/ML) CONTINUOUS INHALATION SOLN: 10.0000 mg/h | INHALATION_SOLUTION | RESPIRATORY_TRACT | Status: DC

## 2021-04-30 MED ORDER — ALBUTEROL SULFATE (2.5 MG/3ML) 0.083% IN NEBU
10.0000 mg | INHALATION_SOLUTION | Freq: Once | RESPIRATORY_TRACT | Status: AC
  Administered 2021-04-30: 10 mg via RESPIRATORY_TRACT

## 2021-04-30 MED ORDER — METHYLPREDNISOLONE SODIUM SUCC 125 MG IJ SOLR
60.0000 mg | Freq: Every day | INTRAMUSCULAR | Status: DC
  Administered 2021-05-01: 60 mg via INTRAVENOUS
  Filled 2021-04-30: qty 2

## 2021-04-30 MED ORDER — MONTELUKAST SODIUM 10 MG PO TABS
10.0000 mg | ORAL_TABLET | Freq: Every day | ORAL | Status: DC
  Administered 2021-04-30: 10 mg via ORAL
  Filled 2021-04-30: qty 1

## 2021-04-30 MED ORDER — MAGNESIUM SULFATE IN D5W 1-5 GM/100ML-% IV SOLN
1.0000 g | Freq: Once | INTRAVENOUS | Status: AC
  Administered 2021-04-30: 1 g via INTRAVENOUS
  Filled 2021-04-30: qty 100

## 2021-04-30 MED ORDER — ALBUTEROL (5 MG/ML) CONTINUOUS INHALATION SOLN
INHALATION_SOLUTION | RESPIRATORY_TRACT | Status: AC
  Administered 2021-04-30: 10 mg/h via RESPIRATORY_TRACT
  Filled 2021-04-30: qty 2

## 2021-04-30 MED ORDER — AMLODIPINE BESYLATE 5 MG PO TABS
5.0000 mg | ORAL_TABLET | Freq: Every day | ORAL | Status: DC
  Administered 2021-05-01: 5 mg via ORAL
  Filled 2021-04-30: qty 1

## 2021-04-30 MED ORDER — LACTATED RINGERS IV BOLUS
1000.0000 mL | Freq: Once | INTRAVENOUS | Status: AC
Start: 2021-04-30 — End: 2021-05-01
  Administered 2021-04-30: 1000 mL via INTRAVENOUS

## 2021-04-30 MED ORDER — FLUTICASONE PROPIONATE HFA 44 MCG/ACT IN AERO
2.0000 | INHALATION_SPRAY | Freq: Every day | RESPIRATORY_TRACT | Status: DC
Start: 2021-04-30 — End: 2021-04-30
  Administered 2021-04-30: 2 via RESPIRATORY_TRACT
  Filled 2021-04-30: qty 10.6

## 2021-04-30 NOTE — ED Notes (Signed)
17:45 Continuous NEB stopped, placed on BiPAP and Back to back duonebs started ?

## 2021-04-30 NOTE — Plan of Care (Signed)
?  Problem: Activity: ?Goal: Ability to perform activities at highest level will improve ?Outcome: Progressing ?  ?Problem: Respiratory: ?Goal: Will regain and/or maintain adequate ventilation ?Outcome: Progressing ?Goal: Diagnostic test results will improve ?Outcome: Progressing ?Goal: Identification of resources available to assist in meeting health care needs will improve ?Outcome: Progressing ?  ?

## 2021-04-30 NOTE — Assessment & Plan Note (Signed)
Potassium of 3.4 on admission and has received oral potassium supplement. Repeat K now since he has received numerous albuterol treatments.  Replete as needed. ?

## 2021-04-30 NOTE — ED Provider Notes (Signed)
?[facility] EMERGENCY DEPARTMENT ?Provider Note ? ? ?CSN: [id number] ?Arrival date & time: 04/30/21  1702 ? ?  ? ?History ? ?Chief Complaint  ?Patient presents with  ? Shortness of Breath  ? ? ?Gregory Vance is a 41 y.o. male. ? ? ?Shortness of Breath ? ?Patient is a 41 year old male with a past medical history significant for asthma states that he has not been able to afford his steroid inhaler for over 6 months because of the cost.  He states he is using his albuterol inhaler every day.  States that over the past few weeks he has been feeling more short of breath and over the past 1 week he states that he has been coughing and short of breath.  He states that his breathing got worse today and he came to the ER for evaluation of this. ? ?Denies chest pain.  No nausea or vomiting.  No diarrhea or abdominal pain. ? ?No recent surgeries, hospitalization, long travel, hemoptysis, estrogen containing OCP, cancer history.  No unilateral leg swelling.  No history of PE or VTE.  ? ? ? ?  ? ?Home Medications ?Prior to Admission medications   ?Medication Sig Start Date End Date Taking? Authorizing Provider  ?albuterol (PROVENTIL) (2.5 MG/3ML) 0.083% nebulizer solution Take 3 mLs (2.5 mg total) by nebulization every 6 (six) hours as needed for wheezing or shortness of breath. 10/07/20   Ernie Avena, MD  ?ALBUTEROL IN Inhale into the lungs.    [provider]  ?budesonide-formoterol (SYMBICORT) 80-4.5 MCG/ACT inhaler Inhale 2 puffs into the lungs as needed. 10/07/20   Ernie Avena, MD  ?   ? ?Allergies    ?Patient has no known allergies.   ? ?Review of Systems   ?Review of Systems  ?Respiratory:  Positive for shortness of breath.   ? ?Physical Exam ?Updated Vital Signs ?BP (!) 131/97   Pulse (!) 119   Temp 97.7 ?F (36.5 ?C) (Tympanic)   Resp (!) 21   Ht 5\' 7"  (1.702 m)   Wt 88.5 kg   SpO2 97%   BMI 30.54 kg/m?  ?Physical Exam ?Vitals and nursing note reviewed.  ?Constitutional:   ?   General: He is  in acute distress.  ?   Comments: Diaphoretic  ?HENT:  ?   Head: Normocephalic and atraumatic.  ?   Nose: Nose normal.  ?   Mouth/Throat:  ?   Mouth: Mucous membranes are moist.  ?Eyes:  ?   General: No scleral icterus. ?Cardiovascular:  ?   Rate and Rhythm: Normal rate and regular rhythm.  ?   Pulses: Normal pulses.  ?   Heart sounds: Normal heart sounds.  ?Pulmonary:  ?   Effort: No respiratory distress.  ?   Breath sounds: Wheezing present.  ?   Comments: In acute respiratory distress.  Patient is standing at bedside unable to sit because of increased work of breathing and accessory muscle usage.  Speaking in 2-3 word sentences.  Gasping for air with visually prolonged expiratory phase. ?Abdominal:  ?   Palpations: Abdomen is soft.  ?   Tenderness: There is no abdominal tenderness.  ?Musculoskeletal:  ?   Cervical back: Normal range of motion.  ?   Right lower leg: No edema.  ?   Left lower leg: No edema.  ?Skin: ?   General: Skin is warm and dry.  ?   Capillary Refill: Capillary refill takes less than 2 seconds.  ?Neurological:  ?   Mental  Status: Mental status is at baseline.  ?Psychiatric:     ?   Mood and Affect: Mood normal.     ?   Behavior: Behavior normal.  ? ? ?ED Results / Procedures / Treatments   ?Labs ?(all labs ordered are listed, but only abnormal results are displayed) ?Labs Reviewed  ?BASIC METABOLIC PANEL - Abnormal; Notable for the following components:  ?    Result Value  ? Potassium 3.4 (*)   ? All other components within normal limits  ?RESP PANEL BY RT-PCR (FLU A&B, COVID) ARPGX2  ?CBC  ? ? ?EKG ?None ? ?Radiology ?DG Chest Port 1 View ? ?Result Date: 04/30/2021 ?CLINICAL DATA:  Shortness of breath EXAM: PORTABLE CHEST 1 VIEW COMPARISON:  10/07/2020 FINDINGS: Cardiac and mediastinal contours are within normal limits. No focal pulmonary opacity. No pleural effusion or pneumothorax. No acute osseous abnormality. IMPRESSION: No acute cardiopulmonary process. Electronically Signed   By: Wiliam Ke M.D.   On: 04/30/2021 17:57   ? ?Procedures ?Marland KitchenCritical Care ?Performed by: Gailen Shelter, PA ?Authorized by: Gailen Shelter, PA  ? ?Critical care provider statement:  ?  Critical care time (minutes):  35 ?  Critical care time was exclusive of:  Separately billable procedures and treating other patients and teaching time ?  Critical care was necessary to treat or prevent imminent or life-threatening deterioration of the following conditions: Hypoxic respiratory failure. ?  Critical care was time spent personally by me on the following activities:  Development of treatment plan with patient or surrogate, discussions with consultants, evaluation of patient's response to treatment, examination of patient, ordering and review of laboratory studies, ordering and review of radiographic studies, ordering and performing treatments and interventions, pulse oximetry, re-evaluation of patient's condition and review of old charts ?  Care discussed with: admitting provider    ? ? ?Medications Ordered in ED ?Medications  ?magnesium sulfate IVPB 1 g 100 mL (1 g Intravenous New Bag/Given 04/30/21 1826)  ?potassium chloride SA (KLOR-CON M) CR tablet 40 mEq (has no administration in time range)  ?fluticasone (FLOVENT HFA) 44 MCG/ACT inhaler 2 puff (has no administration in time range)  ?AeroChamber Plus Flo-Vu Medium MISC 1 each (has no administration in time range)  ?lactated ringers bolus 1,000 mL (has no administration in time range)  ?methylPREDNISolone sodium succinate (SOLU-MEDROL) 125 mg/2 mL injection 125 mg (125 mg Intravenous Given 04/30/21 1719)  ?magnesium sulfate IVPB 1 g 100 mL (1 g Intravenous New Bag/Given 04/30/21 1725)  ?ipratropium (ATROVENT) nebulizer solution 0.5 mg (0.5 mg Nebulization Given 04/30/21 1726)  ?albuterol (PROVENTIL) (2.5 MG/3ML) 0.083% nebulizer solution 10 mg (10 mg Nebulization Given 04/30/21 1720)  ?ipratropium-albuterol (DUONEB) 0.5-2.5 (3) MG/3ML nebulizer solution 3 mL (3 mLs Nebulization  Given 04/30/21 1811)  ?ipratropium-albuterol (DUONEB) 0.5-2.5 (3) MG/3ML nebulizer solution 3 mL (3 mLs Nebulization Given 04/30/21 1837)  ?ipratropium-albuterol (DUONEB) 0.5-2.5 (3) MG/3ML nebulizer solution 3 mL (3 mLs Nebulization Given 04/30/21 1823)  ? ? ?ED Course/ Medical Decision Making/ A&P ?Clinical Course as of 04/30/21 1846  ?Sat Apr 30, 2021  ?1709 History of asthma coughing over the past 1 week more short of breath states that 3 hours ago he became significantly more short of breath came to the ER for treatment. [WF]  ?1825 Currently feeling somewhat improved after continuous nebulizer for approximately 5 mg of albuterol approximately 20 minutes.  Solu-Medrol, 2 g of magnesium, is now receiving back-to-back DuoNebs while on BiPAP.  FiO2 30%..  Satting 98%.  Tachycardic  but work of breathing improved on BiPAP. [WF]  ?  ?Clinical Course User Index ?[WF] Gailen ShelterFondaw, Taysha Majewski S, [place]  ? ?                        ?Medical Decision Making ?Amount and/or Complexity of Data Reviewed ?Labs: ordered. ?Radiology: ordered. ? ?Risk ?Prescription drug management. ? ? ?This patient presents to the ED for concern of SOB, this involves a number of treatment options, and is a complaint that carries with it a high risk of complications and morbidity.  The differential diagnosis includes The causes for shortness of breath include but are not limited to ?Cardiac (AHF, pericardial effusion and tamponade, arrhythmias, ischemia, etc) ?Respiratory (COPD, asthma, pneumonia, pneumothorax, primary pulmonary hypertension, PE/VQ mismatch) ?Hematological (anemia) ?Neuromuscular (ALS, Guillain-Barr?, etc)  ? ? ?Co morbidities: ?Discussed in HPI ? ? ?Brief History: ? ?Patient is a 41 year old male with a past medical history significant for asthma states that he has not been able to afford his steroid inhaler for over 6 months because of the cost.  He states he is using his albuterol inhaler every day.  States that over the past few weeks he has  been feeling more short of breath and over the past 1 week he states that he has been coughing and short of breath.  He states that his breathing got worse today and he came to the ER for evaluation of this. ? ?Deni

## 2021-04-30 NOTE — ED Notes (Signed)
Called [facility] for Transfer ?

## 2021-04-30 NOTE — Progress Notes (Signed)
Pt is resting well on 2L O2. No resp distress noted. Pt is able to talk to me in full sentences and bipap is not needed at this time. RT will continue to monitor.  ?

## 2021-04-30 NOTE — H&P (Signed)
?History and Physical  ? ? ?Patient: Gregory Vance AYT:[id number] DOB: [birth year] ?DOA: 04/30/2021 ?DOS: the patient was seen and examined on 04/30/2021 ?PCP: [facility], [facility]  ?Patient coming from: Outside Hospital [facility] ED ? ?Chief Complaint:  ?Chief Complaint  ?Patient presents with  ? Shortness of Breath  ? ?HPI: Gregory Vance is a 41 y.o. male with medical history significant of moderate persistent asthma and hypertension who presents with concerns of acute asthma exacerbation from outside ED. ? ?He reports acute increasing shortness of breath today with coughing and chest soreness.  Reports using up to a combination of albuterol nebulizer and DuoNebs about 4-5 times today without relief.  Previously was on [facility] for maintenance therapy but has not taken for several months due to cost. ? ?He travels frequently from work and reports returning from [place], [place] about 2 days ago.  Denies any lower extremity edema or pain.  Chest tightness related to increasing shortness of breath. ? ?In the ED, he was afebrile, tachycardic and noted to be hypoxic down to 88% on room air.  He initially was placed on 4 L and later required BiPAP.  He was able to wean down to room air on arrival here to [facility].  He was given continuous neb of albuterol, last IV Solu-Medrol, 2 g of IV magnesium and DuoNeb x3 in the ED. ?Chest x-ray negative for any acute findings.  Negative flu and COVID test. ?No leukocytosis or anemia.  Mild hypokalemia of 3.4 which was repleted with 40 mEq of oral K. ? ?EKG showing sinus tachycardia on my review. ? ? ?Review of Systems: As mentioned in the history of present illness. All other systems reviewed and are negative. ?Past Medical History:  ?Diagnosis Date  ? Asthma   ? Herpes   ? ?Past Surgical History:  ?Procedure Laterality Date  ? FEMUR FRACTURE SURGERY    ? LEG SURGERY    ? ?Social History:  reports that he has never smoked. He has never used smokeless tobacco.  He reports that he does not drink alcohol and does not use drugs. ? ?No Known Allergies ? ?History reviewed. No pertinent family history. ? ?Prior to Admission medications   ?Medication Sig Start Date End Date Taking? Authorizing Provider  ?albuterol (VENTOLIN HFA) 108 (90 Base) MCG/ACT inhaler 2 puffs every 4 (four) hours as needed for wheezing or shortness of breath. 03/08/21  Yes [provider]  ?amLODipine (NORVASC) 5 MG tablet Take 5 mg by mouth daily. 01/11/21  Yes [provider]  ?ipratropium-albuterol (DUONEB) 0.5-2.5 (3) MG/3ML SOLN Take 3 mLs by nebulization every 6 (six) hours as needed (sob/wheezing). 01/11/21  Yes [provider]  ?montelukast (SINGULAIR) 10 MG tablet Take 10 mg by mouth at bedtime.   Yes [provider]  ?albuterol (PROVENTIL) (2.5 MG/3ML) 0.083% nebulizer solution Take 3 mLs (2.5 mg total) by nebulization every 6 (six) hours as needed for wheezing or shortness of breath. ?Patient not taking: Reported on 04/30/2021 10/07/20   Ernie Avena, MD  ?budesonide-formoterol [facility]) 80-4.5 MCG/ACT inhaler Inhale 2 puffs into the lungs as needed. ?Patient not taking: Reported on 04/30/2021 10/07/20   Ernie Avena, MD  ? ? ?Physical Exam: ?Vitals:  ? 04/30/21 1816 04/30/21 1900 04/30/21 1936 04/30/21 2127  ?BP: (!) 131/97 (!) 160/106  (!) 157/94  ?Pulse: (!) 119 (!) 110  (!) 110  ?Resp: (!) 21 13  20   ?Temp: 97.7 ?F (36.5 ?C)   98 ?F (36.7 ?C)  ?  TempSrc: Tympanic   Oral  ?SpO2: 97% 97% 96% 96%  ?Weight:      ?Height:      ? ?Constitutional: NAD, calm, comfortable, middle-age male laying at approximately 30 degree incline in bed ?Eyes: lids and conjunctivae normal ?ENMT: Mucous membranes are moist.  ?Neck: normal, supple,  ?Respiratory: Diffuse expiratory wheezing throughout.  Normal respiratory effort on room air. No accessory muscle use.  Able to speak in full sentences. ?Cardiovascular: Regular rate and rhythm, no murmurs / rubs / gallops. No extremity edema.  2+ pedal pulses.  ?Abdomen: no tenderness, nBowel sounds positive.  ?Musculoskeletal: no clubbing / cyanosis. No joint deformity upper and lower extremities. Good ROM, no contractures. Normal muscle tone.  ?Skin: no rashes, lesions, ulcers.  ?Neurologic: CN 2-12 grossly intact.Strength 5/5 in all 4.  ?Psychiatric: Normal judgment and insight. Alert and oriented x 3. Normal mood. ?Data Reviewed: ? ?See HPI ? ?Assessment and Plan: ?Acute respiratory failure with hypoxia (HCC) ?Secondary to acute asthma exacerbation ?-Patient initially required BiPAP in ED and was able to wean down to room air on arrival.  Has received continuous neb of albuterol, last IV Solu-Medrol, 2 g of IV magnesium and DuoNeb x3 in the ED. ?-Scheduled q6hr levalbuterol due to tachycardia ?-Continue IV Solu-Medrol 60 mg ?-resume Dulera -TOCM consulted for medication assistant. Could also consider Symbicort at discharge ? ?Hypokalemia ?Potassium of 3.4 on admission and has received oral potassium supplement. Repeat K now since he has received numerous albuterol treatments.  Replete as needed. ? ?HTN (hypertension) ?Continue home amlodipine ? ? ? ? ? Advance Care Planning:   Code Status: Full Code  ? ?Consults: none ? ?Family Communication: no family at bedside ? ?Severity of Illness: ?The appropriate patient status for this patient is OBSERVATION. Observation status is judged to be reasonable and necessary in order to provide the required intensity of service to ensure the patient's safety. The patient's presenting symptoms, physical exam findings, and initial radiographic and laboratory data in the context of their medical condition is felt to place them at decreased risk for further clinical deterioration. Furthermore, it is anticipated that the patient will be medically stable for discharge from the hospital within 2 midnights of admission.  ? ?Author: Anselm Jungling, DO ?04/30/2021 10:23 PM ? ?For on call review [email or web address].  ?

## 2021-04-30 NOTE — ED Notes (Signed)
Pt report given to oncoming RN Susanna. All questions and concerns addressed.  ?

## 2021-04-30 NOTE — Assessment & Plan Note (Signed)
Secondary to acute asthma exacerbation ?-Patient initially required BiPAP in ED and was able to wean down to room air on arrival.  Has received continuous neb of albuterol, last IV Solu-Medrol, 2 g of IV magnesium and DuoNeb x3 in the ED. ?-Scheduled q6hr levalbuterol due to tachycardia ?-Continue IV Solu-Medrol 60 mg ?-resume Dulera -TOCM consulted for medication assistant. Could also consider Symbicort at discharge ?

## 2021-04-30 NOTE — ED Notes (Signed)
Patient at registration window, tripoding, Sp2 88% on r/a, to room 7 p[laced on 4LPM O2, nebs started. ?

## 2021-04-30 NOTE — ED Notes (Signed)
Pt off bipap at this time. Tolerating PO liquids and speaking in full sentences.  ?

## 2021-04-30 NOTE — ED Triage Notes (Signed)
Pt arrives pov, endorses shob x 3 hrs pta. Hx of asthma, difficulty speaking in complete sentences. ?

## 2021-04-30 NOTE — Assessment & Plan Note (Signed)
-  Continue home amlodipine 

## 2021-05-01 DIAGNOSIS — J45901 Unspecified asthma with (acute) exacerbation: Secondary | ICD-10-CM

## 2021-05-01 LAB — HIV ANTIBODY (ROUTINE TESTING W REFLEX): HIV Screen 4th Generation wRfx: NONREACTIVE

## 2021-05-01 LAB — BASIC METABOLIC PANEL
Anion gap: 9 (ref 5–15)
BUN: 9 mg/dL (ref 6–20)
CO2: 24 mmol/L (ref 22–32)
Calcium: 9.5 mg/dL (ref 8.9–10.3)
Chloride: 104 mmol/L (ref 98–111)
Creatinine, Ser: 1.08 mg/dL (ref 0.61–1.24)
GFR, Estimated: >60 mL/min (ref >60)
Glucose, Bld: 160 mg/dL — ABNORMAL HIGH (ref 70–99)
Potassium: 3.9 mmol/L (ref 3.5–5.1)
Sodium: 137 mmol/L (ref 135–145)

## 2021-05-01 MED ORDER — AMLODIPINE BESYLATE 5 MG PO TABS
5.0000 mg | ORAL_TABLET | Freq: Every day | ORAL | 2 refills | Status: AC
Start: 2021-05-01 — End: ?

## 2021-05-01 MED ORDER — PREDNISONE 10 MG PO TABS: ORAL_TABLET | ORAL | 0 refills | Status: AC

## 2021-05-01 MED ORDER — ALBUTEROL SULFATE HFA 108 (90 BASE) MCG/ACT IN AERS
2.0000 | INHALATION_SPRAY | RESPIRATORY_TRACT | 2 refills | Status: AC | PRN
Start: 2021-05-01 — End: ?

## 2021-05-01 MED ORDER — BUDESONIDE-FORMOTEROL FUMARATE 80-4.5 MCG/ACT IN AERO: 2.0000 | INHALATION_SPRAY | Freq: Two times a day (BID) | RESPIRATORY_TRACT | 3 refills | Status: AC

## 2021-05-01 NOTE — Plan of Care (Signed)
Discharge instructions reviewed with patient and significant other, questions answered, verbalized understanding. Patient ambulatory to main entrance to be taken home by significant other.   

## 2021-05-01 NOTE — TOC Initial Note (Signed)
Transition of Care (TOC) - Initial/Assessment Note  ? ? ?Patient Details  ?Name: Gregory Vance ?MRN: [id number] ?Date of Birth: 10/26/1980 ? ?Transition of Care (TOC) CM/SW Contact:    ?Tawanna Cooler, RN ?Phone Number: ?05/01/2021, 10:58 AM ? ?Clinical Narrative:                 ? ?Received a consult that patient needs medication assistance, specifically he has trouble paying for his Dulera.  Spoke with patient by phone and at bedside. He does have insurance, but it's a high deductible health plan.  Cost for month Dulera with his insurance is $300+. Assisted with downloading a coupon from the [facility] website that will lower the cost, but monthly cost still potentially in the $250 range.  Discussed with patient about possibly changing to a comparable medication that will cost less, but he states Dulera really did help him, unsure about changing.  Patient not eligible for a Match letter because he has insurance.  Discussed using the GoodRx app for coupons as well.   ?TOC unable to find any other alternatives to assist patient.   ? ? ? ?Expected Discharge Plan: Home/Self Care ?  ? ?Expected Discharge Plan and Services ?Expected Discharge Plan: Home/Self Care ?  ?  ?Prior Living Arrangements/Services ?  ?  ?Patient language and need for interpreter reviewed:: Yes ?       ?Need for Family Participation in Patient Care: No (Comment) ?Care giver support system in place?: Yes (comment) ?  ?Criminal Activity/Legal Involvement Pertinent to Current Situation/Hospitalization: No - Comment as needed ? ?Activities of Daily Living ?Home Assistive Devices/Equipment: None ?ADL Screening (condition at time of admission) ?Patient's cognitive ability adequate to safely complete daily activities?: Yes ?Is the patient deaf or have difficulty hearing?: No ?Does the patient have difficulty seeing, even when wearing glasses/contacts?: No ?Does the patient have difficulty concentrating, remembering, or making decisions?: No ?Patient able to  express need for assistance with ADLs?: Yes ?Does the patient have difficulty dressing or bathing?: No ?Independently performs ADLs?: Yes (appropriate for developmental age) ?Does the patient have difficulty walking or climbing stairs?: No ?Weakness of Legs: None ?Weakness of Arms/Hands: None ? ?Emotional Assessment ?Appearance:: Appears stated age ?Attitude/Demeanor/Rapport: Engaged ?Affect (typically observed): Appropriate ?Orientation: : Oriented to Self, Oriented to Place, Oriented to  Time, Oriented to Situation ?Alcohol / Substance Use: Not Applicable ?Psych Involvement: No (comment) ? ?Admission diagnosis:  Asthma exacerbation [J45.901] ?Acute hypoxemic respiratory failure ([place]) [J96.01] ?Severe asthma with exacerbation, unspecified whether persistent [J45.901] ?Patient Active Problem List  ? Diagnosis Date Noted  ? Asthma exacerbation 04/30/2021  ? HTN (hypertension) 04/30/2021  ? Hypokalemia 04/30/2021  ? Acute respiratory failure with hypoxia ([place]) 04/30/2021  ? ?PCP:  [facility] ?Pharmacy:   ?[id number] - [place], [place] - [address] ?[address] ?[place] [address] ?Phone: [phone] Fax: [phone] ? ? ?

## 2021-05-01 NOTE — Progress Notes (Signed)
SATURATION QUALIFICATIONS: (This note is used to comply with regulatory documentation for home oxygen) ? ?Patient Saturations on Room Air at Rest = 93% ? ?Patient Saturations on Room Air while Ambulating = 95% ? ?Patient Saturations on n/a Liters of oxygen while Ambulating = n/a ? ?Please briefly explain why patient needs home oxygen: patient does not meet requirements for home O2  ?

## 2021-05-01 NOTE — Discharge Summary (Signed)
?Physician Discharge Summary ?  ?Patient: Gregory Vance MRN: [id number] DOB: 01/17/1981  ?Admit date:     04/30/2021  ?Discharge date: 05/01/21  ?Discharge Physician: Meredeth Ide  ? ?PCP: [facility], [facility]  ? ?Recommendations at discharge:  ? ?Follow-up PCP in 2 weeks ? ?Discharge Diagnoses: ?Principal Problem: ?  Asthma exacerbation ?Active Problems: ?  HTN (hypertension) ?  Hypokalemia ?  Acute respiratory failure with hypoxia ([facility]) ? ?Resolved Problems: ?  * No resolved hospital problems. * ? ?Hospital Course: ?41 year old male with history of moderate persistent asthma, hypertension presented with asthma exacerbation ? ?Assessment and Plan: ? ?Acute respiratory failure with hypoxia (HCC) ?Secondary to acute asthma exacerbation ?-Significantly improved with IV Solu-Medrol, DuoNeb, IV magnesium ?-At this time we will discharge patient home on prednisone taper ?-Continue Symbicort, DuoNeb nebulizers as needed ? ? ?Hypokalemia ?Replete ? ?HTN (hypertension) ?Continue home amlodipine 5 mg daily ? ? ? ? ?  ? ? ?Consultants:  ?Procedures performed:  ?Disposition: Home ?Diet recommendation:  ?Discharge Diet Orders (From admission, onward)  ? ?  Start     Ordered  ? 05/01/21 0000  Diet - low sodium heart healthy       ? 05/01/21 1148  ? ?  ?  ? ?  ? ?Regular diet ?DISCHARGE MEDICATION: ?Allergies as of 05/01/2021   ?No Known Allergies ?  ? ?  ?Medication List  ?  ? ?TAKE these medications   ? ?albuterol 108 (90 Base) MCG/ACT inhaler ?Commonly known as: VENTOLIN HFA ?Inhale 2 puffs into the lungs every 4 (four) hours as needed for wheezing or shortness of breath. ?What changed:  ?how to take this ?Another medication with the same name was removed. Continue taking this medication, and follow the directions you see here. ?  ?amLODipine 5 MG tablet ?Commonly known as: NORVASC ?Take 1 tablet (5 mg total) by mouth daily. ?  ?budesonide-formoterol 80-4.5 MCG/ACT inhaler ?Commonly known as: Symbicort ?Inhale  2 puffs into the lungs in the morning and at bedtime. ?What changed:  ?when to take this ?reasons to take this ?  ?ipratropium-albuterol 0.5-2.5 (3) MG/3ML Soln ?Commonly known as: DUONEB ?Take 3 mLs by nebulization every 6 (six) hours as needed (sob/wheezing). ?  ?montelukast 10 MG tablet ?Commonly known as: SINGULAIR ?Take 10 mg by mouth at bedtime. ?  ?predniSONE 10 MG tablet ?Commonly known as: DELTASONE ?Prednisone 40 mg po daily x 1 day then Prednisone 30 mg po daily x 1 day then Prednisone 20 mg po daily x 1 day then Prednisone 10 mg daily x 1 day then stop... ?  ? ?  ? ? ?Discharge Exam: ?Filed Weights  ? 04/30/21 1708 04/30/21 2312  ?Weight: 88.5 kg 88.5 kg  ? ?General-appears in no acute distress ?Heart-S1-S2, regular, no murmur auscultated ?Lungs-clear to auscultation bilaterally, no wheezing or crackles auscultated ?Abdomen-soft, nontender, no organomegaly ?Extremities-no edema in the lower extremities ?Neuro-alert, oriented x3, no focal deficit noted ? ?Condition at discharge: good ? ?The results of significant diagnostics from this hospitalization (including imaging, microbiology, ancillary and laboratory) are listed below for reference.  ? ?Imaging Studies: ?DG Chest Port 1 View ? ?Result Date: 04/30/2021 ?CLINICAL DATA:  Shortness of breath EXAM: PORTABLE CHEST 1 VIEW COMPARISON:  10/07/2020 FINDINGS: Cardiac and mediastinal contours are within normal limits. No focal pulmonary opacity. No pleural effusion or pneumothorax. No acute osseous abnormality. IMPRESSION: No acute cardiopulmonary process. Electronically Signed   By: Wiliam Ke M.D.   On: 04/30/2021 17:57   ? ?  Microbiology: ?Results for orders placed or performed during the hospital encounter of 04/30/21  ?Resp Panel by RT-PCR (Flu A&B, Covid) Nasopharyngeal Swab     Status: None  ? Collection Time: 04/30/21  5:12 PM  ? Specimen: Nasopharyngeal Swab; Nasopharyngeal(NP) swabs in vial transport medium  ?Result Value Ref Range Status  ? SARS  Coronavirus 2 by RT PCR NEGATIVE NEGATIVE Final  ?  Comment: (NOTE) ?SARS-CoV-2 target nucleic acids are NOT DETECTED. ? ?The SARS-CoV-2 RNA is generally detectable in upper respiratory ?specimens during the acute phase of infection. The lowest ?concentration of SARS-CoV-2 viral copies this assay can detect is ?138 copies/mL. A negative result does not preclude SARS-Cov-2 ?infection and should not be used as the sole basis for treatment or ?other patient management decisions. A negative result may occur with  ?improper specimen collection/handling, submission of specimen other ?than nasopharyngeal swab, presence of viral mutation(s) within the ?areas targeted by this assay, and inadequate number of viral ?copies(<138 copies/mL). A negative result must be combined with ?clinical observations, patient history, and epidemiological ?information. The expected result is Negative. ? ?Fact Sheet for Patients:  ?BloggerCourse.com ? ?Fact Sheet for Healthcare Providers:  ?SeriousBroker.it ? ?This test is no t yet approved or cleared by the [place] FDA and  ?has been authorized for detection and/or diagnosis of SARS-CoV-2 by ?FDA under an Emergency Use Authorization (EUA). This EUA will remain  ?in effect (meaning this test can be used) for the duration of the ?COVID-19 declaration under Section 564(b)(1) of the Act, 21 ?U.S.C.section 360bbb-3(b)(1), unless the authorization is terminated  ?or revoked sooner.  ? ? ?  ? Influenza A by PCR NEGATIVE NEGATIVE Final  ? Influenza B by PCR NEGATIVE NEGATIVE Final  ?  Comment: (NOTE) ?The Xpert Xpress SARS-CoV-2/FLU/RSV plus assay is intended as an aid ?in the diagnosis of influenza from Nasopharyngeal swab specimens and ?should not be used as a sole basis for treatment. Nasal washings and ?aspirates are unacceptable for Xpert Xpress SARS-CoV-2/FLU/RSV ?testing. ? ?Fact Sheet for  Patients: ?BloggerCourse.com ? ?Fact Sheet for Healthcare Providers: ?SeriousBroker.it ? ?This test is not yet approved or cleared by the [place] FDA and ?has been authorized for detection and/or diagnosis of SARS-CoV-2 by ?FDA under an Emergency Use Authorization (EUA). This EUA will remain ?in effect (meaning this test can be used) for the duration of the ?COVID-19 declaration under Section 564(b)(1) of the Act, 21 U.S.C. ?section 360bbb-3(b)(1), unless the authorization is terminated or ?revoked. ? ?Performed at [facility], [address] [place] ?[place], [place] [address] ?  ? ? ?Labs: ?CBC: ?Recent Labs  ?Lab 04/30/21 ?1712  ?WBC 9.8  ?HGB 15.4  ?HCT 45.5  ?MCV 92.5  ?PLT 351  ? ?Basic Metabolic Panel: ?Recent Labs  ?Lab 04/30/21 ?1712 04/30/21 ?2236 05/01/21 ?0354  ?NA 140 139 137  ?K 3.4* 3.6 3.9  ?CL 105 105 104  ?CO2 26 26 24   ?GLUCOSE 90 136* 160*  ?BUN 8 8 9   ?CREATININE 1.15 1.03 1.08  ?CALCIUM 9.3 9.4 9.5  ? ? ? ?Discharge time spent: greater than 30 minutes. ? ?Signed: ? , MD ?[facility] ?05/01/2021 ?

## 2022-08-31 IMAGING — DX DG CHEST 1V PORT
1 series · 1 of 1 positions shown · non-contrast
Comparison: 10/07/2020

CLINICAL DATA: Shortness of breath

EXAM:
PORTABLE CHEST 1 VIEW

[chest ap]
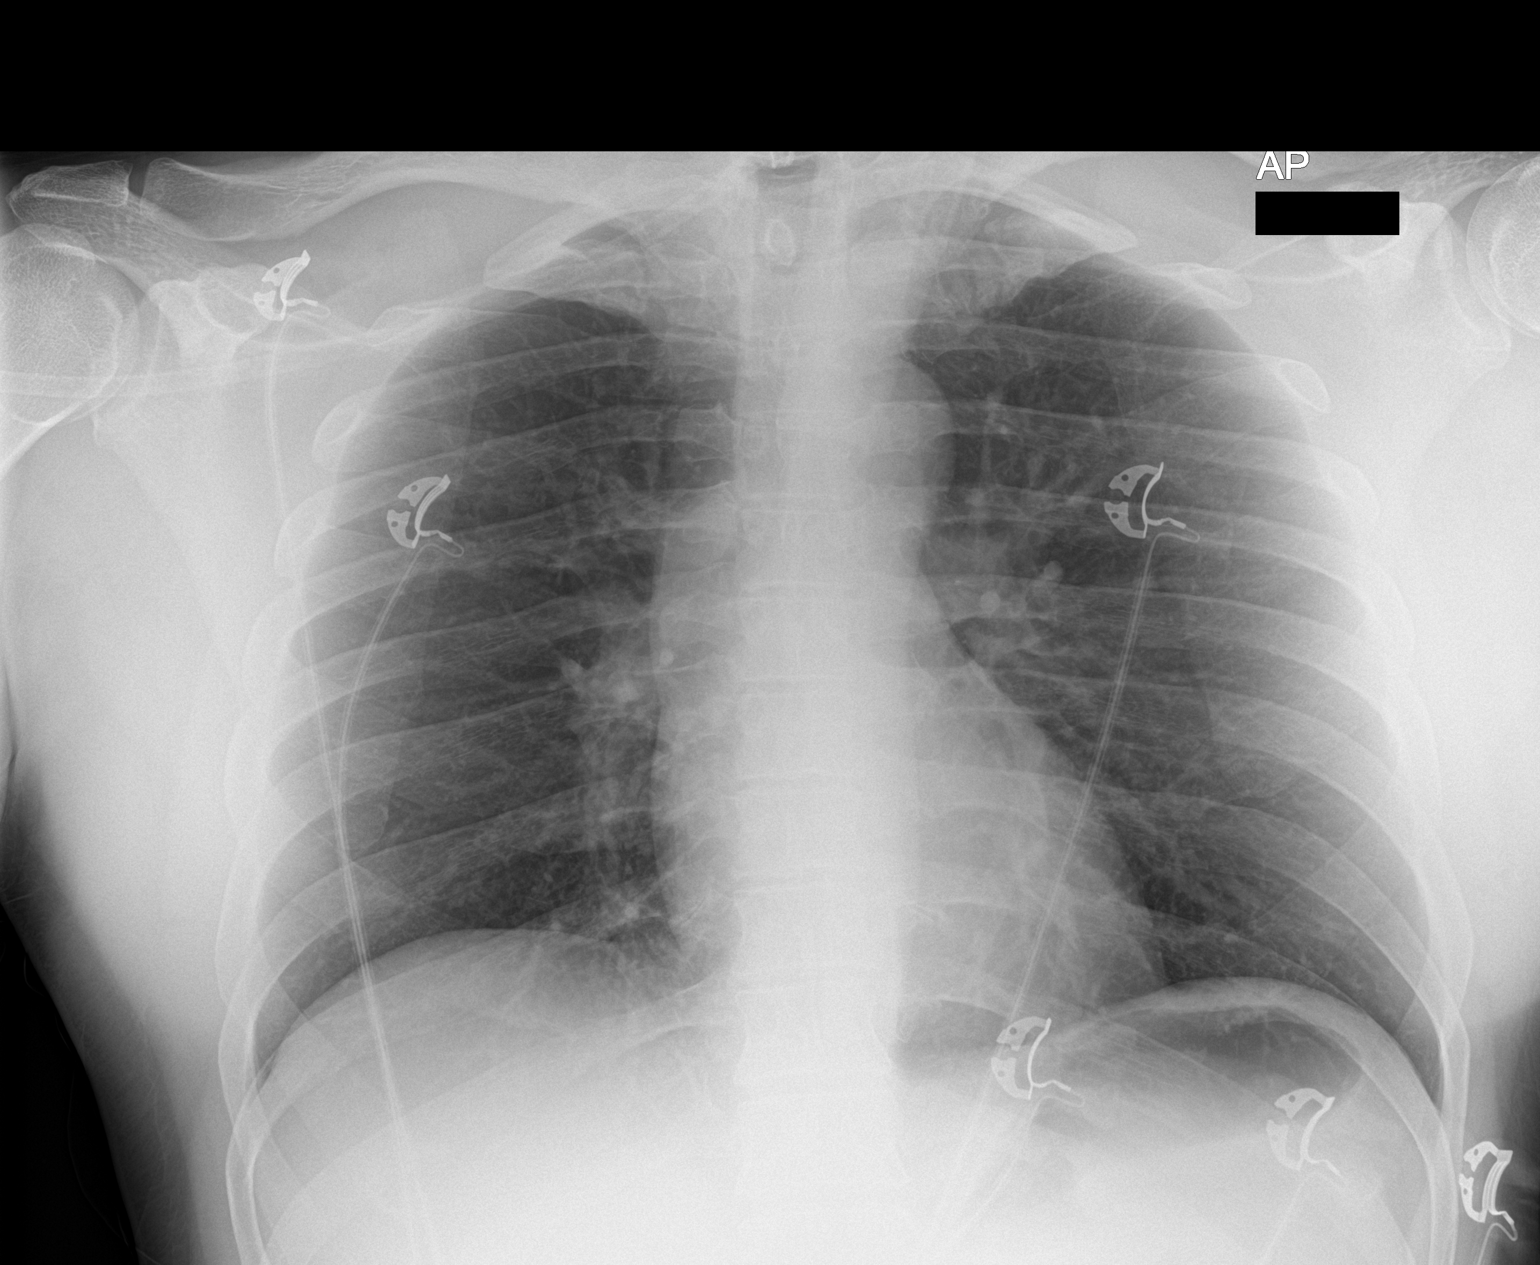

[1 of 1 positions shown; findings below may reference images not displayed]

FINDINGS: Cardiac and mediastinal contours are within normal limits. No focal
pulmonary opacity. No pleural effusion or pneumothorax. No acute
osseous abnormality.
IMPRESSION: No acute cardiopulmonary process.
